# Patient Record
Sex: Female | Born: 1995 | Race: Black or African American | Hispanic: No | Marital: Single | Smoking: Never smoker
Health system: Southern US, Community
[De-identification: ages and names within clinical notes are randomized; demographics above are authoritative.]

## PROBLEM LIST (undated history)

## (undated) DIAGNOSIS — E282 Polycystic ovarian syndrome: Secondary | ICD-10-CM

## (undated) DIAGNOSIS — E669 Obesity, unspecified: Secondary | ICD-10-CM

## (undated) HISTORY — DX: Obesity, unspecified: E66.9

## (undated) HISTORY — DX: Polycystic ovarian syndrome: E28.2

---

## 2019-10-03 ENCOUNTER — Ambulatory Visit: Payer: Self-pay | Admitting: Obstetrics and Gynecology

## 2020-05-24 ENCOUNTER — Telehealth: Payer: Self-pay | Admitting: Hematology and Oncology

## 2020-05-24 NOTE — Telephone Encounter (Signed)
Received a new hem referral from Dr. Juliene Pina for fhx of thromboembolic disorder. Ms. cuello has been cld and scheduled to see Dr. Al Pimple on 3/31 at 1pm. Pt aware to arrive 20 minutes early.

## 2020-06-07 ENCOUNTER — Inpatient Hospital Stay: Payer: BLUE CROSS/BLUE SHIELD

## 2020-06-07 ENCOUNTER — Other Ambulatory Visit: Payer: Self-pay

## 2020-06-07 ENCOUNTER — Encounter: Payer: Self-pay | Admitting: Hematology and Oncology

## 2020-06-07 ENCOUNTER — Inpatient Hospital Stay: Payer: BLUE CROSS/BLUE SHIELD | Attending: Hematology and Oncology | Admitting: Hematology and Oncology

## 2020-06-07 ENCOUNTER — Encounter: Payer: Self-pay | Admitting: General Practice

## 2020-06-07 VITALS — BP 103/73 | HR 85 | Temp 97.9°F | Resp 18 | Ht 62.0 in | Wt 198.4 lb

## 2020-06-07 DIAGNOSIS — Z832 Family history of diseases of the blood and blood-forming organs and certain disorders involving the immune mechanism: Secondary | ICD-10-CM | POA: Insufficient documentation

## 2020-06-07 DIAGNOSIS — Z823 Family history of stroke: Secondary | ICD-10-CM

## 2020-06-07 DIAGNOSIS — Z79899 Other long term (current) drug therapy: Secondary | ICD-10-CM | POA: Diagnosis not present

## 2020-06-07 DIAGNOSIS — E282 Polycystic ovarian syndrome: Secondary | ICD-10-CM | POA: Insufficient documentation

## 2020-06-07 DIAGNOSIS — Z8249 Family history of ischemic heart disease and other diseases of the circulatory system: Secondary | ICD-10-CM

## 2020-06-07 DIAGNOSIS — E663 Overweight: Secondary | ICD-10-CM | POA: Insufficient documentation

## 2020-06-07 NOTE — Progress Notes (Signed)
[facility] CONSULT NOTE  Patient Care Team: Pcp, No as PCP - General  CHIEF COMPLAINTS/PURPOSE OF CONSULTATION:  Family history of embolic disorder.  ASSESSMENT & PLAN:  No problem-specific Assessment & Plan notes found for this encounter.  Orders Placed This Encounter  Procedures  . Factor 5 leiden    Standing Status:   Future    Standing Expiration Date:   06/07/2021  . Ambulatory referral to Social Work    Referral Priority:   Routine    Referral Type:   Consultation    Referral Reason:   Specialty Services Required    Number of Visits Requested:   1   This is a very pleasant 25 year old female patient with past medical history significant for PCOS and related health issues referred to hematology because of her family history of thromboembolic disorder.  Patient has never had any personal history of thromboembolic disorders however her mom and maternal grandmother had blood clots.  Her mom had her PE while on birth control pills.  Patient is currently taking progestin only pill but was wondering if she has any risk of blood clots with this medication.  She denies any pregnancies or miscarriages. I discussed that progestin only medication does not increase risk of blood clots significantly.  However if she were to take estrogen-based medication, it may be of value to test her factor V Leiden status.  Even if factor V Leiden is negative, if she can avoid estrogen-based birth control given her family history, this would be recommended.  Since she never had any personal history of DVT/PE, I do not believe that she needs an entire hypercoagulable work panel.  I also discussed that pregnancy is a high estrogen state and increases risk of blood clots.  I have asked her to return to clinic when pregnant to discuss role of prophylactic anticoagulation and to assess for hypercoagulable disorder. We have discussed about symptoms and signs of DVT/PE and to go to the nearest hospital if  she notices any of the symptoms. She expressed understanding of all the recommendations.  Thank you for consulting [place] in the care of this patient.   Please not hesitate to contact us with any additional questions or concerns.  Rachel Moulds MD   HISTORY OF PRESENTING ILLNESS:   [place]and[place] Wise 25 y.o. female is here because of family history of thromboembolic disorder. This is a very pleasant 25 year old female patient referred to hematology for recommendations given her family history of DVT/PE.  Patient presently has never had any thromboembolic episodes.  Her mom had PE while she was on birth control pills also had a DVT but the context is not clear.  Patient has personal history of PCOS and has been tried on several progestin only medications, currently on camela which is norethindrone.  She has never had to take any estrogen-based medication. She feels well except for the overweight and related problems.  She is motivated to lose weight and is currently taking Metformin, Phentermine and norethindrone Rest of the pertinent 10 point ROS reviewed and negative.  REVIEW OF SYSTEMS:   Constitutional: Denies fevers, chills or abnormal night sweats Eyes: Denies blurriness of vision, double vision or watery eyes Ears, nose, mouth, throat, and face: Denies mucositis or sore throat Respiratory: Denies cough, dyspnea or wheezes Cardiovascular: Denies palpitation, chest discomfort or lower extremity swelling Gastrointestinal:  Denies nausea, heartburn or change in bowel habits Skin: Denies abnormal skin rashes Lymphatics: Denies new lymphadenopathy or easy bruising Neurological:Denies numbness,  tingling or new weaknesses Behavioral/Psych: Mood is stable, no new changes  All other systems were reviewed with the patient and are negative.  MEDICAL HISTORY:  History reviewed. No pertinent past medical history.  SURGICAL HISTORY: History reviewed. No pertinent surgical history.  SOCIAL  HISTORY: Social History   Socioeconomic History  . Marital status: Single    Spouse name: Not on file  . Number of children: Not on file  . Years of education: Not on file  . Highest education level: Not on file  Occupational History  . Not on file  Tobacco Use  . Smoking status: Not on file  . Smokeless tobacco: Not on file  Substance and Sexual Activity  . Alcohol use: Not on file  . Drug use: Not on file  . Sexual activity: Not on file  Other Topics Concern  . Not on file  Social History Narrative  . Not on file   Social Determinants of Health   Financial Resource Strain: Not on file  Food Insecurity: Not on file  Transportation Needs: Not on file  Physical Activity: Not on file  Stress: Not on file  Social Connections: Not on file  Intimate Partner Violence: Not on file    FAMILY HISTORY: History reviewed. No pertinent family history.  No family history of malignancies.  ALLERGIES:  has No Known Allergies.  MEDICATIONS:  Current Outpatient Medications  Medication Sig Dispense Refill  . Drospirenone (SLYND) 4 MG TABS Take 4 mg by mouth 1 day or 1 dose.     No current facility-administered medications for this visit.     PHYSICAL EXAMINATION:  ECOG PERFORMANCE STATUS: 0 - Asymptomatic  Vitals:   06/07/20 1333  BP: 103/73  Pulse: 85  Resp: 18  Temp: 97.9 F (36.6 C)  SpO2: 100%   Filed Weights   06/07/20 1333  Weight: 198 lb 6.4 oz (90 kg)    GENERAL:alert, no distress and comfortable SKIN: skin color, texture, turgor are normal, no rashes or significant lesions EYES: normal, conjunctiva are pink and non-injected, sclera clear OROPHARYNX:no exudate, no erythema and lips, buccal mucosa, and tongue normal  NECK: supple, thyroid normal size, non-tender, without nodularity LYMPH:  no palpable lymphadenopathy in the cervical, axillary or inguinal LUNGS: clear to auscultation and percussion with normal breathing effort HEART: regular rate & rhythm  and no murmurs and no lower extremity edema ABDOMEN:abdomen soft, non-tender and normal bowel sounds Musculoskeletal:no cyanosis of digits and no clubbing  PSYCH: alert & oriented x 3 with fluent speech NEURO: no focal motor/sensory deficits  LABORATORY DATA:  I have reviewed the data as listed No results found for: WBC, HGB, HCT, MCV, PLT   Chemistry   No results found for: NA, K, CL, CO2, BUN, CREATININE, GLU No results found for: CALCIUM, ALKPHOS, AST, ALT, BILITOT     RADIOGRAPHIC STUDIES: I have personally reviewed the radiological images as listed and agreed with the findings in the report. No results found.  All questions were answered. The patient knows to call the clinic with any problems, questions or concerns. I spent 30 minutes in the care of this patient including H and P, review of records, counseling and coordination of care.     Rachel Moulds, MD 06/07/2020 2:09 PM

## 2020-06-07 NOTE — Progress Notes (Signed)
[facility] Psychosocial Distress Screening Clinical Social Work  Clinical Social Work was referred by distress screening protocol.  The patient scored a 9 on the Psychosocial Distress Thermometer which indicates severe distress. Clinical Social Worker contacted patient by phone to assess for distress and other psychosocial needs. Called patient, no answer, left VM w my contact information and encouragement to call back.   ONCBCN DISTRESS SCREENING 06/07/2020  Screening Type Initial Screening  Distress experienced in past week (1-10) 9  Family Problem type Partner  Emotional problem type Nervousness/Anxiety;Isolation/feeling alone;Depression  Physical Problem type Loss of appetitie  Physician notified of physical symptoms Yes  Referral to clinical social work Yes  Referral to support programs Yes    Clinical Social Worker follow up needed: No.  If yes, follow up plan:  Sallee Lange, LCSW   Santa Genera, LCSW Clinical Social Worker Phone:  [phone]

## 2020-06-08 ENCOUNTER — Encounter: Payer: Self-pay | Admitting: General Practice

## 2020-06-08 NOTE — Progress Notes (Signed)
[facility] Psychosocial Distress Screening Clinical Social Work  Clinical Social Work was referred by distress screening protocol.  The patient scored a 9 on the Psychosocial Distress Thermometer which indicates severe distress. Clinical Social Worker contacted patient by phone to assess for distress and other psychosocial needs. Patient answered, stated "this is not a good time to talk, can I call you on Monday."  Patient will call CSW Craig on Monday.    ONCBCN DISTRESS SCREENING 06/07/2020  Screening Type Initial Screening  Distress experienced in past week (1-10) 9  Family Problem type Partner  Emotional problem type Nervousness/Anxiety;Isolation/feeling alone;Depression  Physical Problem type Loss of appetitie  Physician notified of physical symptoms Yes  Referral to clinical social work Yes  Referral to support programs Yes    Clinical Social Worker follow up needed: Yes.    If yes, follow up plan:  See note  Sallee Lange, LCSW   Santa Genera, LCSW Clinical Social Worker Phone:  [phone]

## 2020-06-14 ENCOUNTER — Telehealth: Payer: Self-pay

## 2020-06-14 LAB — FACTOR 5 LEIDEN

## 2020-06-14 NOTE — Telephone Encounter (Signed)
-----   Message from Rachel Moulds, MD sent at 06/14/2020  1:13 PM EDT ----- Regarding: RE: Questions Regarding Recent Factor 5 Leiden Labs Contact: [phone] Sure, if you want to let her know since its negative. She doesn't have the factor V leiden mutation.  I can call her later.  ----- Message ----- From: Laverta Baltimore, RN Sent: 06/14/2020  11:23 AM EDT To: Rachel Moulds, MD Subject: Questions Regarding Recent Factor 5 Leiden L#  Hello Dr. Al Pimple, Patient called requesting information about her recent lab results. If you would be able to call her back at your convenience, the patient would appreciate it.  Thank you!

## 2020-06-14 NOTE — Telephone Encounter (Signed)
Patient informed of her recent lab results. Patient verbalized an understanding of the information and had no further questions or concerns.

## 2020-09-19 ENCOUNTER — Other Ambulatory Visit: Payer: Self-pay

## 2020-09-19 ENCOUNTER — Encounter (HOSPITAL_COMMUNITY): Payer: Self-pay

## 2020-09-19 ENCOUNTER — Emergency Department (HOSPITAL_COMMUNITY)
Admission: EM | Admit: 2020-09-19 | Discharge: 2020-09-20 | Disposition: A | Discharge status: Home / Self Care | Payer: BLUE CROSS/BLUE SHIELD | Attending: Emergency Medicine | Admitting: Emergency Medicine

## 2020-09-19 ENCOUNTER — Emergency Department (HOSPITAL_COMMUNITY): Payer: BLUE CROSS/BLUE SHIELD

## 2020-09-19 DIAGNOSIS — N83291 Other ovarian cyst, right side: Secondary | ICD-10-CM | POA: Insufficient documentation

## 2020-09-19 DIAGNOSIS — R1031 Right lower quadrant pain: Secondary | ICD-10-CM | POA: Diagnosis present

## 2020-09-19 DIAGNOSIS — N83209 Unspecified ovarian cyst, unspecified side: Secondary | ICD-10-CM

## 2020-09-19 LAB — CBC
HCT: 40.7 % (ref 36.0–46.0)
Hemoglobin: 13.3 g/dL (ref 12.0–15.0)
MCH: 29.4 pg (ref 26.0–34.0)
MCHC: 32.7 g/dL (ref 30.0–36.0)
MCV: 89.8 fL (ref 80.0–100.0)
Platelets: 315 10*3/uL (ref 150–400)
RBC: 4.53 MIL/uL (ref 3.87–5.11)
RDW: 12.8 % (ref 11.5–15.5)
WBC: 7.1 10*3/uL (ref 4.0–10.5)
nRBC: 0 % (ref 0.0–0.2)

## 2020-09-19 LAB — URINALYSIS, ROUTINE W REFLEX MICROSCOPIC
Bilirubin Urine: NEGATIVE
Glucose, UA: NEGATIVE mg/dL
Ketones, ur: NEGATIVE mg/dL
Nitrite: NEGATIVE
Protein, ur: NEGATIVE mg/dL
Specific Gravity, Urine: 1.004 — ABNORMAL LOW (ref 1.005–1.030)
pH: 6 (ref 5.0–8.0)

## 2020-09-19 LAB — COMPREHENSIVE METABOLIC PANEL
ALT: 13 U/L (ref 0–44)
AST: 15 U/L (ref 15–41)
Albumin: 4.2 g/dL (ref 3.5–5.0)
Alkaline Phosphatase: 58 U/L (ref 38–126)
Anion gap: 7 (ref 5–15)
BUN: 6 mg/dL (ref 6–20)
CO2: 25 mmol/L (ref 22–32)
Calcium: 9.3 mg/dL (ref 8.9–10.3)
Chloride: 107 mmol/L (ref 98–111)
Creatinine, Ser: 0.77 mg/dL (ref 0.44–1.00)
GFR, Estimated: >60 mL/min (ref >60)
Glucose, Bld: 92 mg/dL (ref 70–99)
Potassium: 3.8 mmol/L (ref 3.5–5.1)
Sodium: 139 mmol/L (ref 135–145)
Total Bilirubin: 0.6 mg/dL (ref 0.3–1.2)
Total Protein: 7.4 g/dL (ref 6.5–8.1)

## 2020-09-19 LAB — LIPASE, BLOOD: Lipase: 27 U/L (ref 11–51)

## 2020-09-19 LAB — I-STAT BETA HCG BLOOD, ED (MC, WL, AP ONLY): I-stat hCG, quantitative: <5 m[IU]/mL (ref <5)

## 2020-09-19 MED ORDER — ONDANSETRON HCL 4 MG/2ML IJ SOLN
4.0000 mg | Freq: Once | INTRAMUSCULAR | Status: AC
  Administered 2020-09-19: 4 mg via INTRAVENOUS
  Filled 2020-09-19: qty 2

## 2020-09-19 MED ORDER — HYDROMORPHONE HCL 1 MG/ML IJ SOLN
1.0000 mg | Freq: Once | INTRAMUSCULAR | Status: AC
  Administered 2020-09-19: 1 mg via INTRAVENOUS
  Filled 2020-09-19: qty 1

## 2020-09-19 MED ORDER — IOHEXOL 350 MG/ML SOLN
80.0000 mL | Freq: Once | INTRAVENOUS | Status: AC | PRN
  Administered 2020-09-19: 80 mL via INTRAVENOUS

## 2020-09-19 MED ORDER — HYDROMORPHONE HCL 1 MG/ML IJ SOLN
1.0000 mg | Freq: Once | INTRAMUSCULAR | Status: AC
Start: 2020-09-19 — End: 2020-09-19
  Administered 2020-09-19: 1 mg via INTRAVENOUS
  Filled 2020-09-19: qty 1

## 2020-09-19 MED ORDER — SODIUM CHLORIDE (PF) 0.9 % IJ SOLN
INTRAMUSCULAR | Status: AC
  Filled 2020-09-19: qty 50

## 2020-09-19 MED ORDER — ONDANSETRON HCL 4 MG/2ML IJ SOLN
4.0000 mg | Freq: Once | INTRAMUSCULAR | Status: AC
Start: 2020-09-19 — End: 2020-09-19
  Administered 2020-09-19: 4 mg via INTRAVENOUS
  Filled 2020-09-19: qty 2

## 2020-09-19 NOTE — ED Notes (Signed)
Pt to CT, will update V/S upon return

## 2020-09-19 NOTE — ED Provider Notes (Signed)
[facility]-EMERGENCY DEPT Provider Note   CSN: [id number] Arrival date & time: 09/19/20  1919     History Chief Complaint  Patient presents with   Abdominal Pain    Alexandria Wise is a 25 y.o. female.  [name] has a history of PCOS and endometriosis.  She states that her right lower quadrant abdominal pain initially felt like her period cramps, but she is not bleeding.  Pain worsened to the point where she sought care at urgent care.  She was sent for further evaluation.  The history is provided by the patient.  Abdominal Pain Pain location:  RLQ Pain quality: cramping and sharp   Pain radiates to:  Does not radiate Pain severity:  Severe Onset quality:  Gradual Duration:  2 days Timing:  Constant Progression:  Worsening Chronicity:  New Context: not sick contacts, not suspicious food intake and not trauma   Relieved by:  Nothing Worsened by:  Nothing Ineffective treatments:  NSAIDs Associated symptoms: no anorexia, no chest pain, no chills, no constipation, no cough, no diarrhea, no dysuria, no fever, no hematuria, no nausea, no shortness of breath, no sore throat, no vaginal bleeding, no vaginal discharge and no vomiting       Past Medical History:  Diagnosis Date   Obesity (BMI 35.0-39.9 without comorbidity)    PCOS (polycystic ovarian syndrome)     There are no problems to display for this patient.   History reviewed. No pertinent surgical history.   OB History   No obstetric history on file.     History reviewed. No pertinent family history.  Social History   Tobacco Use   Smoking status: Never  Substance Use Topics   Drug use: Never    Home Medications Prior to Admission medications   Medication Sig Start Date End Date Taking? Authorizing Provider  Drospirenone (SLYND) 4 MG TABS Take 4 mg by mouth 1 day or 1 dose.    [provider]    Allergies    Patient has no known allergies.  Review of Systems    Review of Systems  Constitutional:  Negative for chills and fever.  HENT:  Negative for ear pain and sore throat.   Eyes:  Negative for pain and visual disturbance.  Respiratory:  Negative for cough and shortness of breath.   Cardiovascular:  Negative for chest pain and palpitations.  Gastrointestinal:  Positive for abdominal pain. Negative for anorexia, constipation, diarrhea, nausea and vomiting.  Genitourinary:  Negative for dysuria, hematuria, vaginal bleeding and vaginal discharge.  Musculoskeletal:  Negative for arthralgias and back pain.  Skin:  Negative for color change and rash.  Neurological:  Negative for seizures and syncope.  All other systems reviewed and are negative.  Physical Exam Updated Vital Signs BP 112/70   Pulse 73   Temp 99 F (37.2 C) (Oral)   Resp 17   Ht 5\' 2"  (1.575 m)   Wt 89.4 kg   SpO2 100%   BMI 36.03 kg/m   Physical Exam Vitals and nursing note reviewed.  HENT:     Head: Normocephalic and atraumatic.  Eyes:     General: No scleral icterus. Pulmonary:     Effort: Pulmonary effort is normal. No respiratory distress.  Abdominal:     Tenderness: There is abdominal tenderness in the right lower quadrant.  Musculoskeletal:     Cervical back: Normal range of motion.  Skin:    General: Skin is warm and dry.  Neurological:  Mental Status: She is alert.  Psychiatric:        Mood and Affect: Mood normal.    ED Results / Procedures / Treatments   Labs (all labs ordered are listed, but only abnormal results are displayed) Labs Reviewed  URINALYSIS, ROUTINE W REFLEX MICROSCOPIC - Abnormal; Notable for the following components:      Result Value   Color, Urine STRAW (*)    Specific Gravity, Urine 1.004 (*)    Hgb urine dipstick MODERATE (*)    Leukocytes,Ua SMALL (*)    Bacteria, UA MANY (*)    All other components within normal limits  LIPASE, BLOOD  COMPREHENSIVE METABOLIC PANEL  CBC  I-STAT BETA HCG BLOOD, ED ([facility], [facility], [facility] ONLY)     EKG None  Radiology US Transvaginal Non-OB  Result Date: 09/20/2020 CLINICAL DATA:  Right lower quadrant pain, ovarian mass EXAM: TRANSABDOMINAL AND TRANSVAGINAL ULTRASOUND OF PELVIS DOPPLER ULTRASOUND OF OVARIES TECHNIQUE: Both transabdominal and transvaginal ultrasound examinations of the pelvis were performed. Transabdominal technique was performed for global imaging of the pelvis including uterus, ovaries, adnexal regions, and pelvic cul-de-sac. It was necessary to proceed with endovaginal exam following the transabdominal exam to visualize the uterus, endometrium, and ovaries. Color and duplex Doppler ultrasound was utilized to evaluate blood flow to the ovaries. COMPARISON:  CT 09/19/2020 FINDINGS: Uterus Measurements: 7.1 x 3.6 x 4 cm = volume: 53.8 mL. Anteverted uterus. No concerning uterine mass or lesion. Endometrium Thickness: 4 mm, non thickened.  No focal endometrial abnormality. Right ovary Measurements: 10.1 x 8 x 8.5 cm = volume: 362.8 mL. 8.9 x 6.3 x 8 cm predominantly cystic appearing mass with some low level homogeneous internal echogenicity along the periphery (image 100) as well as a larger heterogeneous though avascular mural nodule measuring up to 3 cm in size (image 96). Left ovary Measurements: 4.3 x 2.5 x 2.6 cm = volume: 14.4 mL. Normal follicles in the left ovary. No concerning left ovarian mass is seen. Pulsed Doppler evaluation of both ovaries demonstrates normal low-resistance arterial and venous waveforms. Other findings Small to moderate volume of hemoperitoneum best demonstrated on image 68. IMPRESSION: Large solid cystic mass arising from the right ovary with some conspicuous internal complexity. Portion of this mass demonstrates homogeneous low level echogenicity with convex margins which could suggest an underlying hemorrhagic lesion with a larger avascular mural nodule possibly reflecting adherent clot. However, based on the size of this lesion, outpatient MR  imaging should be pursued with close clinical follow-up by gynecology. Small volume of hemoperitoneum could be related to cyst rupture or possible rupture of the aforementioned possible hemorrhagic cyst. Preserved Doppler waveforms of the right and left ovary though certainly given the size of the mass in the right adnexa, intermittent torsion cannot be fully excluded. These results were called by telephone at the time of interpretation on 09/20/2020 at 12:18 am to provider [facility] , who verbally acknowledged these results. Electronically Signed   By: Kreg Shropshire M.D.   On: 09/20/2020 00:18   US Pelvis Complete  Result Date: 09/20/2020 CLINICAL DATA:  Right lower quadrant pain, ovarian mass EXAM: TRANSABDOMINAL AND TRANSVAGINAL ULTRASOUND OF PELVIS DOPPLER ULTRASOUND OF OVARIES TECHNIQUE: Both transabdominal and transvaginal ultrasound examinations of the pelvis were performed. Transabdominal technique was performed for global imaging of the pelvis including uterus, ovaries, adnexal regions, and pelvic cul-de-sac. It was necessary to proceed with endovaginal exam following the transabdominal exam to visualize the uterus, endometrium, and ovaries. Color and duplex Doppler ultrasound  was utilized to evaluate blood flow to the ovaries. COMPARISON:  CT 09/19/2020 FINDINGS: Uterus Measurements: 7.1 x 3.6 x 4 cm = volume: 53.8 mL. Anteverted uterus. No concerning uterine mass or lesion. Endometrium Thickness: 4 mm, non thickened.  No focal endometrial abnormality. Right ovary Measurements: 10.1 x 8 x 8.5 cm = volume: 362.8 mL. 8.9 x 6.3 x 8 cm predominantly cystic appearing mass with some low level homogeneous internal echogenicity along the periphery (image 100) as well as a larger heterogeneous though avascular mural nodule measuring up to 3 cm in size (image 96). Left ovary Measurements: 4.3 x 2.5 x 2.6 cm = volume: 14.4 mL. Normal follicles in the left ovary. No concerning left ovarian mass is seen. Pulsed Doppler  evaluation of both ovaries demonstrates normal low-resistance arterial and venous waveforms. Other findings Small to moderate volume of hemoperitoneum best demonstrated on image 68. IMPRESSION: Large solid cystic mass arising from the right ovary with some conspicuous internal complexity. Portion of this mass demonstrates homogeneous low level echogenicity with convex margins which could suggest an underlying hemorrhagic lesion with a larger avascular mural nodule possibly reflecting adherent clot. However, based on the size of this lesion, outpatient MR imaging should be pursued with close clinical follow-up by gynecology. Small volume of hemoperitoneum could be related to cyst rupture or possible rupture of the aforementioned possible hemorrhagic cyst. Preserved Doppler waveforms of the right and left ovary though certainly given the size of the mass in the right adnexa, intermittent torsion cannot be fully excluded. These results were called by telephone at the time of interpretation on 09/20/2020 at 12:18 am [facility] , who verbally acknowledged these results. Electronically Signed   By: Kreg ShropshirePrice  DeHay M.D.   On: 09/20/2020 00:18   CT Abdomen Pelvis W Contrast  Result Date: 09/19/2020 CLINICAL DATA:  Right lower quadrant abdominal pain, potential appendicitis EXAM: CT ABDOMEN AND PELVIS WITH CONTRAST TECHNIQUE: Multidetector CT imaging of the abdomen and pelvis was performed using the standard protocol following bolus administration of intravenous contrast. CONTRAST:  80mL OMNIPAQUE IOHEXOL 350 MG/ML SOLN COMPARISON:  None. FINDINGS: Lower chest: Lung bases are clear. Normal heart size. No pericardial effusion. Hepatobiliary: No worrisome focal liver lesions. Smooth liver surface contour. Normal hepatic attenuation. Normal gallbladder and biliary tree. Pancreas: No pancreatic ductal dilatation or surrounding inflammatory changes. Spleen: Normal in size. No concerning splenic lesions. Adrenals/Urinary  Tract: Normal adrenal glands. Kidneys are normally located with symmetric enhancement. No suspicious renal lesion, urolithiasis or hydronephrosis. Urinary bladder is unremarkable for the degree of distention. Stomach/Bowel: Distal esophagus, stomach and duodenal sweep are unremarkable. No small bowel wall thickening or dilatation. No evidence of obstruction. Partially air-filled appendix is seen in the right lower quadrant without focal periappendiceal inflammation to suggest an acute appendicitis. No colonic dilatation or wall thickening. Vascular/Lymphatic: No significant vascular findings are present. No enlarged abdominal or pelvic lymph nodes. Reproductive: Large 7.2 cm cystic lesion arising from the right ovary which appears displaced anteriorly to the uterus. The uterus itself appears slightly retroverted. No concerning left adnexal mass or lesion. Other: Small volume low to intermediate attenuation free fluid in the deep pelvis, indeterminate though could reflect small volume hemoperitoneum such as from a cyst rupture or other pelvic process. Musculoskeletal: No acute osseous abnormality or suspicious osseous lesion. IMPRESSION: 1. 7.2 cm cystic lesion arising from the right ovary. Given that this is ipsilateral to the patient's symptoms, may warrant further interrogation with pelvic ultrasound and Doppler interrogation. 2. Small volume  of low to intermediate attenuation (30 HU) fluid in the deep pelvis is nonspecific, though could reflect some trace hemorrhagic products such as related to cyst rupture. Could also be further assessed at the time of ultrasound. 3. Normal appendix. Electronically Signed   By: Kreg Shropshire M.D.   On: 09/19/2020 22:18   [place] Art/Ven Flow Abd Pelv Doppler  Result Date: 09/20/2020 CLINICAL DATA:  Right lower quadrant pain, ovarian mass EXAM: TRANSABDOMINAL AND TRANSVAGINAL ULTRASOUND OF PELVIS DOPPLER ULTRASOUND OF OVARIES TECHNIQUE: Both transabdominal and transvaginal  ultrasound examinations of the pelvis were performed. Transabdominal technique was performed for global imaging of the pelvis including uterus, ovaries, adnexal regions, and pelvic cul-de-sac. It was necessary to proceed with endovaginal exam following the transabdominal exam to visualize the uterus, endometrium, and ovaries. Color and duplex Doppler ultrasound was utilized to evaluate blood flow to the ovaries. COMPARISON:  CT 09/19/2020 FINDINGS: Uterus Measurements: 7.1 x 3.6 x 4 cm = volume: 53.8 mL. Anteverted uterus. No concerning uterine mass or lesion. Endometrium Thickness: 4 mm, non thickened.  No focal endometrial abnormality. Right ovary Measurements: 10.1 x 8 x 8.5 cm = volume: 362.8 mL. 8.9 x 6.3 x 8 cm predominantly cystic appearing mass with some low level homogeneous internal echogenicity along the periphery (image 100) as well as a larger heterogeneous though avascular mural nodule measuring up to 3 cm in size (image 96). Left ovary Measurements: 4.3 x 2.5 x 2.6 cm = volume: 14.4 mL. Normal follicles in the left ovary. No concerning left ovarian mass is seen. Pulsed Doppler evaluation of both ovaries demonstrates normal low-resistance arterial and venous waveforms. Other findings Small to moderate volume of hemoperitoneum best demonstrated on image 68. IMPRESSION: Large solid cystic mass arising from the right ovary with some conspicuous internal complexity. Portion of this mass demonstrates homogeneous low level echogenicity with convex margins which could suggest an underlying hemorrhagic lesion with a larger avascular mural nodule possibly reflecting adherent clot. However, based on the size of this lesion, outpatient MR imaging should be pursued with close clinical follow-up by gynecology. Small volume of hemoperitoneum could be related to cyst rupture or possible rupture of the aforementioned possible hemorrhagic cyst. Preserved Doppler waveforms of the right and left ovary though certainly  given the size of the mass in the right adnexa, intermittent torsion cannot be fully excluded. These results were called by telephone at the time of interpretation on 09/20/2020 at 12:18 am to provider [facility] , who verbally acknowledged these results. Electronically Signed   By: Kreg Shropshire M.D.   On: 09/20/2020 00:18    Procedures Procedures   Medications Ordered in ED Medications  HYDROmorphone (DILAUDID) injection 1 mg (has no administration in time range)  ondansetron (ZOFRAN) injection 4 mg (has no administration in time range)    ED Course  I have reviewed the triage vital signs and the nursing notes.  Pertinent labs & imaging results that were available during my care of the patient were reviewed by me and considered in my medical decision making (see chart for details).    MDM Rules/Calculators/A&P                          Liticia Drollinger presented with right lower quadrant abdominal pain and concern for appendicitis based on her visit at urgent care.  CT scan revealed a large ovarian cyst, and pelvic ultrasound to further characterize the cyst.  No evidence of ovarian torsion.  She was  given pain control, and follow-up instructions were given. Final Clinical Impression(s) / ED Diagnoses Final diagnoses:  Hemorrhagic cyst of ovary    Rx / DC Orders ED Discharge Orders     None        Koleen Distance, MD 09/20/20 1514

## 2020-09-19 NOTE — ED Provider Notes (Signed)
Emergency Medicine Provider Triage Evaluation Note  Alexandria Wise , a 25 y.o. female  was evaluated in triage.  Pt complains of gradual onset, constant, sharp, RLQ abdominal pain that  began 2 days ago. No fevers, chills,  nausea, vomiting. Has been taking Ibuprofen without relief. Went to an UC today. Had a u/a and a UPT which was negative. They sent her here with concern for appendicitis. No previous abdominal surgeries.  Review of Systems  Positive: + abd pain Negative: - nausea, vomiting, diarrhea, fevers, chills  Physical Exam  BP 119/74 (BP Location: Right Arm)   Pulse 96   Temp 99 F (37.2 C) (Oral)   Resp 18   Ht 5\' 2"  (1.575 m)   Wt 89.4 kg   SpO2 98%   BMI 36.03 kg/m  Gen:   Awake, no distress   Resp:  Normal effort  MSK:   Moves extremities without difficulty  Other:  + diffuse abdominal TTP with voluntary guarding.   Medical Decision Making  Medically screening exam initiated at 7:42 PM.  Appropriate orders placed.  Alexandria Wise was informed that the remainder of the evaluation will be completed by another provider, this initial triage assessment does not replace that evaluation, and the importance of remaining in the ED until their evaluation is complete.     , PA-C 09/19/20 1945    09/21/20, MD 09/19/20 [phone]

## 2020-09-19 NOTE — ED Triage Notes (Signed)
Pt states she is here for potential appendicitis. Pt went to UC today and was examined, no blood work or scans done, pt told to come to ED. Pt c/o abdominal pain to RLQ.

## 2020-09-19 NOTE — ED Provider Notes (Signed)
  Provider Note MRN:  [id number]  Arrival date & time: 09/20/20    ED Course and Medical Decision Making  Assumed care from Dr. Delford Field at shift change.  Lower abdominal pain, CT with large ovarian cyst, awaiting ultrasound and will reassess clinically.  Ultrasound is without evidence of torsion, patient's pain is adequately controlled, will follow-up with GYN.  Procedures  Final Clinical Impressions(s) / ED Diagnoses     ICD-10-CM   1. Hemorrhagic cyst of ovary  N83.209       ED Discharge Orders          Ordered    oxyCODONE (ROXICODONE) 5 MG immediate release tablet  Every 4 hours PRN,   Status:  Discontinued        09/20/20 0051    ondansetron (ZOFRAN ODT) 4 MG disintegrating tablet  Every 8 hours PRN,   Status:  Discontinued        09/20/20 0051    ondansetron (ZOFRAN ODT) 4 MG disintegrating tablet  Every 8 hours PRN        09/20/20 0103    oxyCODONE (ROXICODONE) 5 MG immediate release tablet  Every 4 hours PRN        09/20/20 0103              Discharge Instructions      You were evaluated in the Emergency Department and after careful evaluation, we did not find any emergent condition requiring admission or further testing in the hospital.  Symptoms seem to be due to a large ovarian cyst.  This is a painful condition but should resolve on its own.  Recommend close follow-up with your GYN specialist.  Take Tylenol or Motrin at home for discomfort.  You can use the oxycodone tablets as needed for more significant pain.  Use the Zofran medication as needed for nausea.  The radiologist are recommending that you undergo outpatient MRI at a later date to make sure that this cyst goes away.  Your GYN doctor can schedule this.  Please return to the Emergency Department if you experience any worsening of your condition.   Thank you for allowing [place] to be a part of your care.      Elmer Sow. Pilar Plate, MD [facility] Emergency Medicine [facility]  Health [email or web address]    Sabas Sous, MD 09/20/20 [phone]

## 2020-09-20 MED ORDER — OXYCODONE HCL 5 MG PO TABS: 5.0000 mg | ORAL_TABLET | ORAL | 0 refills | Status: DC | PRN

## 2020-09-20 MED ORDER — OXYCODONE HCL 5 MG PO TABS: 5.0000 mg | ORAL_TABLET | ORAL | 0 refills | Status: AC | PRN

## 2020-09-20 MED ORDER — ONDANSETRON 4 MG PO TBDP: 4.0000 mg | ORAL_TABLET | Freq: Three times a day (TID) | ORAL | 0 refills | Status: AC | PRN

## 2020-09-20 MED ORDER — HYDROMORPHONE HCL 1 MG/ML IJ SOLN
0.5000 mg | Freq: Once | INTRAMUSCULAR | Status: AC
  Administered 2020-09-20: 0.5 mg via INTRAVENOUS
  Filled 2020-09-20: qty 1

## 2020-09-20 MED ORDER — ONDANSETRON 4 MG PO TBDP: 4.0000 mg | ORAL_TABLET | Freq: Three times a day (TID) | ORAL | 0 refills | Status: DC | PRN

## 2020-09-20 NOTE — Discharge Instructions (Addendum)
You were evaluated in the Emergency Department and after careful evaluation, we did not find any emergent condition requiring admission or further testing in the hospital.  Symptoms seem to be due to a large ovarian cyst.  This is a painful condition but should resolve on its own.  Recommend close follow-up with your GYN specialist.  Take Tylenol or Motrin at home for discomfort.  You can use the oxycodone tablets as needed for more significant pain.  Use the Zofran medication as needed for nausea.  The radiologist are recommending that you undergo outpatient MRI at a later date to make sure that this cyst goes away.  Your GYN doctor can schedule this.  Please return to the Emergency Department if you experience any worsening of your condition.   Thank you for allowing [place] to be a part of your care.

## 2023-03-12 IMAGING — US US PELVIS COMPLETE
1 series · 14 of 25 positions shown · non-contrast
Comparison: CT 09/19/2020

CLINICAL DATA: Right lower quadrant pain, ovarian mass

EXAM:
TRANSABDOMINAL AND TRANSVAGINAL ULTRASOUND OF PELVIS
DOPPLER ULTRASOUND OF OVARIES
TECHNIQUE: Both transabdominal and transvaginal ultrasound examinations of the
pelvis were performed. Transabdominal technique was performed for
global imaging of the pelvis including uterus, ovaries, adnexal
regions, and pelvic cul-de-sac.
It was necessary to proceed with endovaginal exam following the
transabdominal exam to visualize the uterus, endometrium, and
ovaries. Color and duplex Doppler ultrasound was utilized to
evaluate blood flow to the ovaries.

[Series 1: us pelvis complete [facility] & [facility] · 14 of 109 slices shown]
[im 1/109]
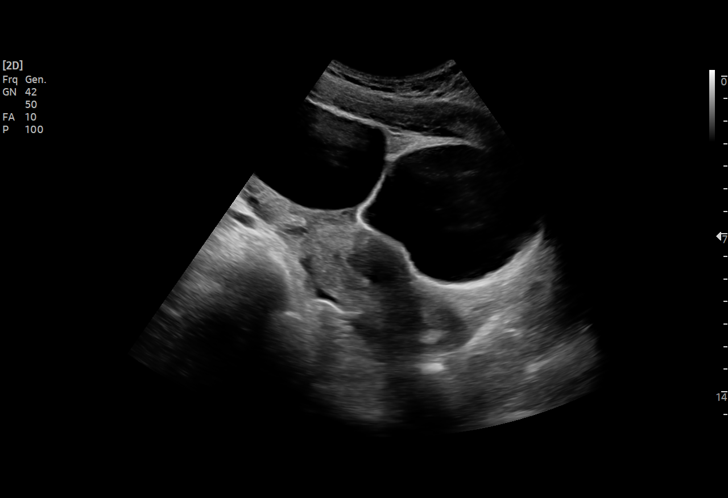
[im 10/109]
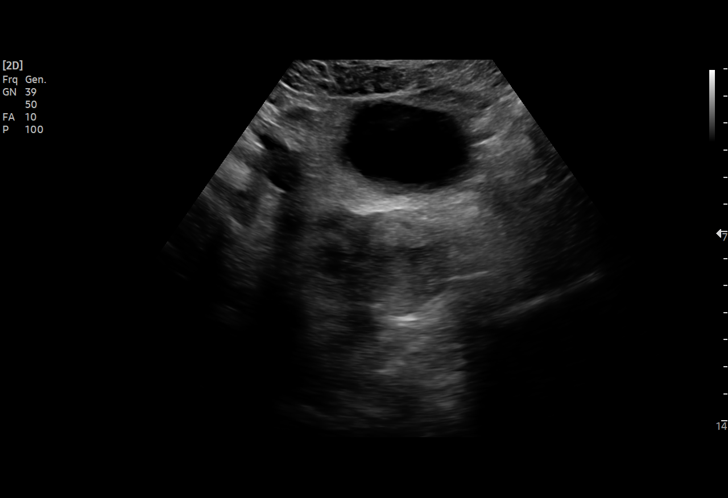
[im 19/109]
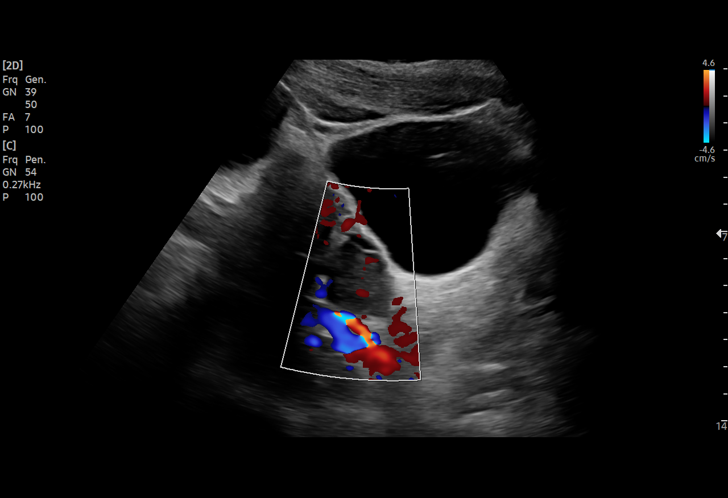
[im 28/109]
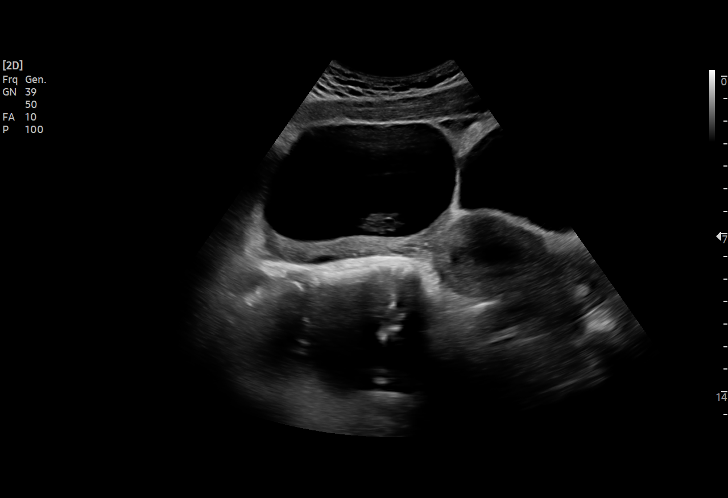
[im 37/109]
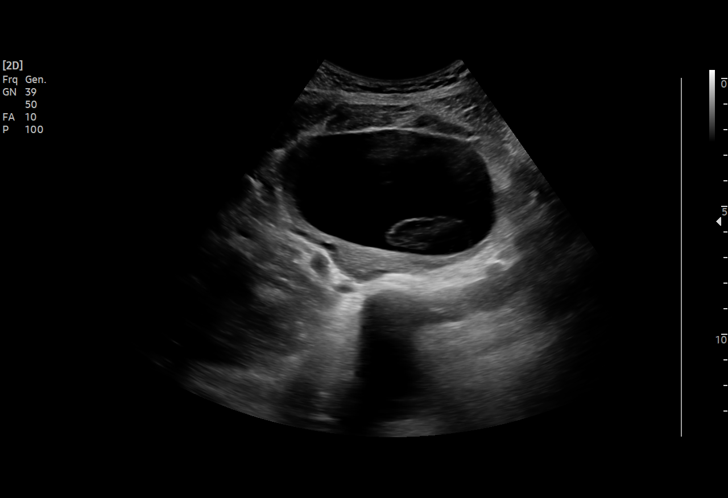
[im 41/109]
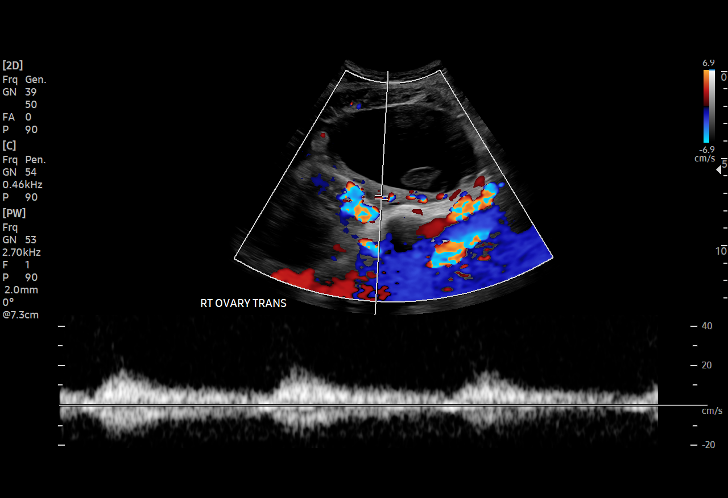
[im 50/109]
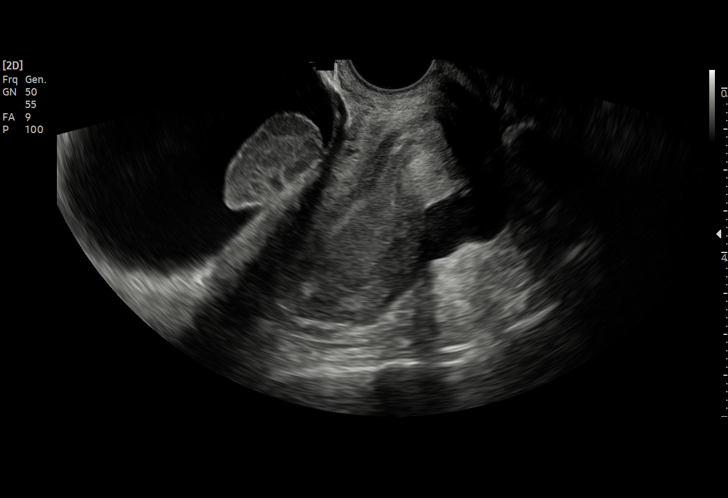
[im 59/109]
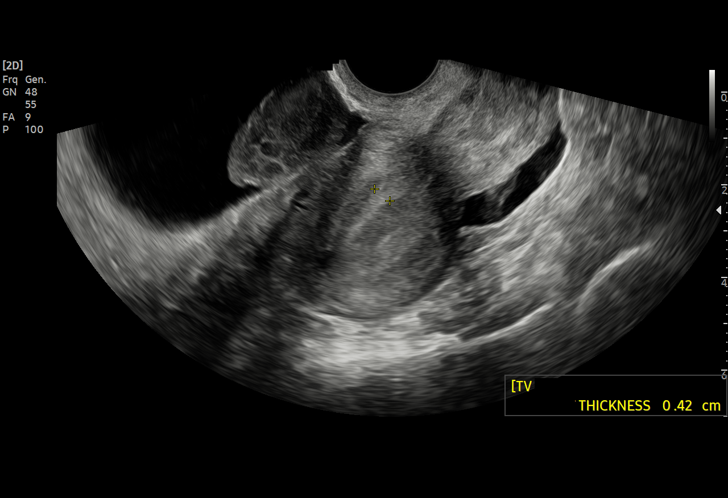
[im 68/109]
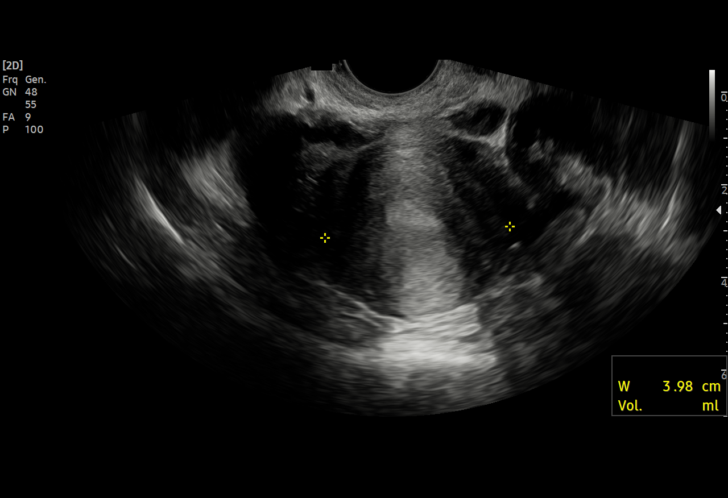
[im 73/109]
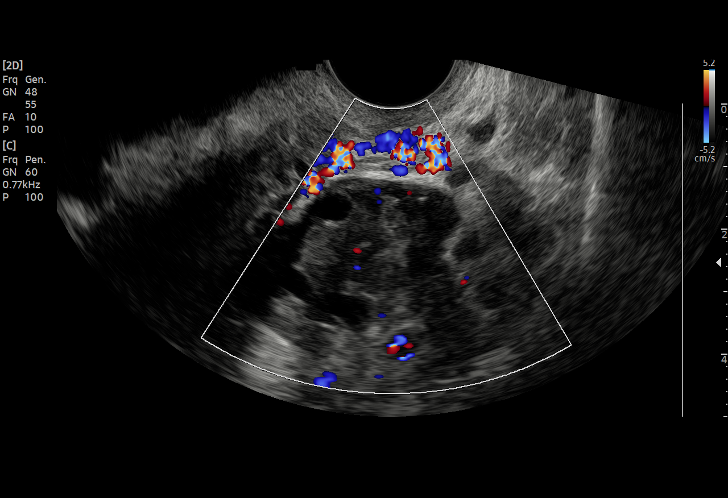
[im 82/109]
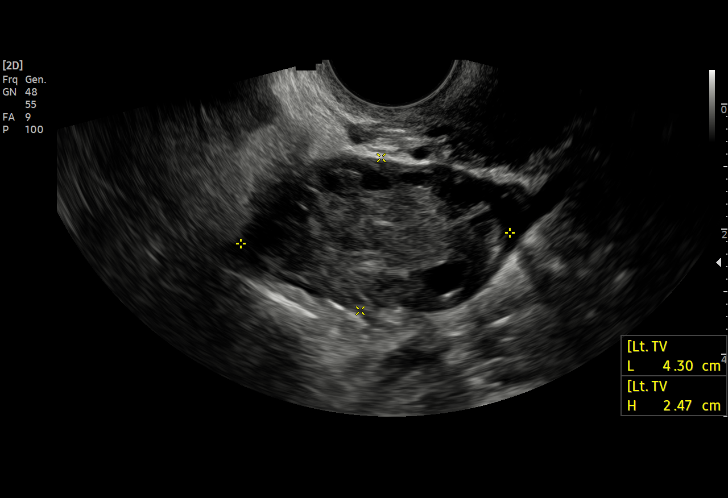
[im 91/109]
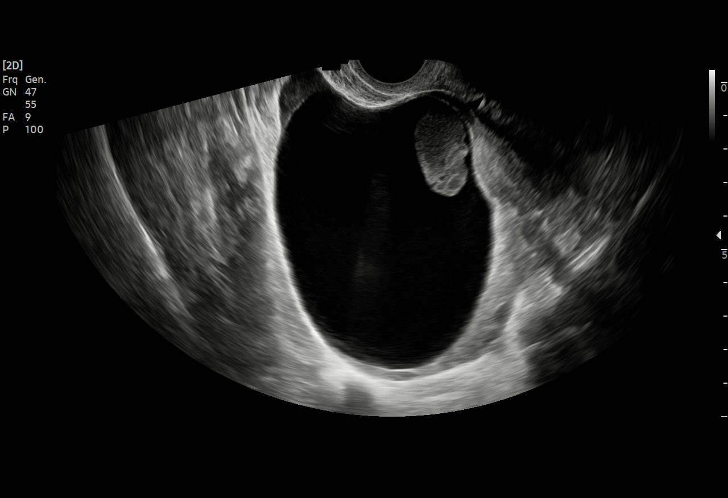
[im 100/109]
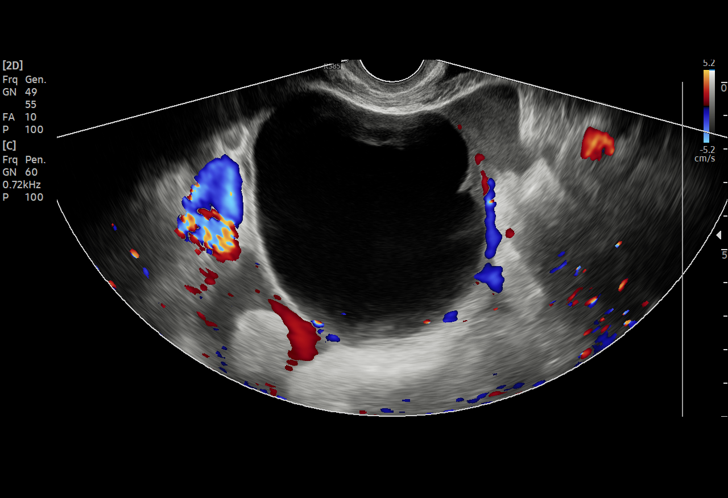
[im 109/109]
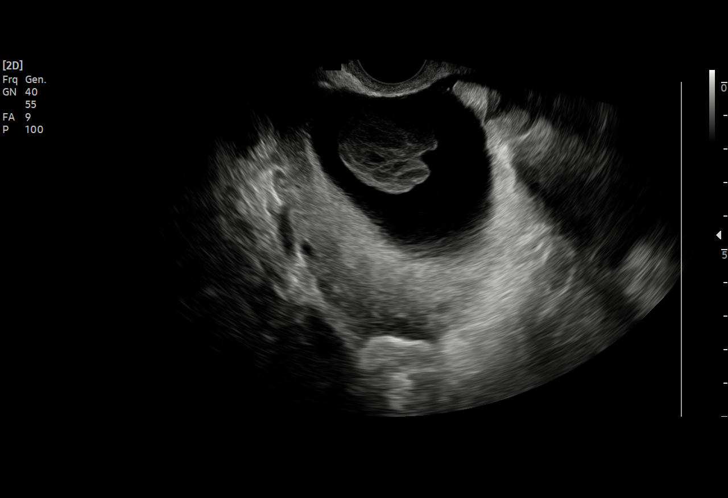

[14 of 25 positions shown; findings below may reference images not displayed]

FINDINGS: Uterus

Measurements: 7.1 x 3.6 x 4 cm = volume: 53.8 mL. Anteverted uterus.
No concerning uterine mass or lesion.

Endometrium

Thickness: 4 mm, non thickened.  No focal endometrial abnormality.

Right ovary

Measurements: 10.1 x 8 x 8.5 cm = volume: 362.8 mL. 8.9 x 6.3 x 8 cm
predominantly cystic appearing mass with some low level homogeneous
internal echogenicity along the periphery (image 100) as well as a
larger heterogeneous though avascular mural nodule measuring up to 3
cm in size (image 96).

Left ovary

Measurements: 4.3 x 2.5 x 2.6 cm = volume: 14.4 mL. Normal follicles
in the left ovary. No concerning left ovarian mass is seen.

Pulsed Doppler evaluation of both ovaries demonstrates normal
low-resistance arterial and venous waveforms.

Other findings

Small to moderate volume of hemoperitoneum best demonstrated on
image 68.
IMPRESSION: Large solid cystic mass arising from the right ovary with some
conspicuous internal complexity. Portion of this mass demonstrates
homogeneous low level echogenicity with convex margins which could
suggest an underlying hemorrhagic lesion with a larger avascular
mural nodule possibly reflecting adherent clot. However, based on
the size of this lesion, outpatient MR imaging should be pursued
with close clinical follow-up by gynecology.

Small volume of hemoperitoneum could be related to cyst rupture or
possible rupture of the aforementioned possible hemorrhagic cyst.

Preserved Doppler waveforms of the right and left ovary though
certainly given the size of the mass in the right adnexa,
intermittent torsion cannot be fully excluded.

These results were called by telephone at the time of interpretation
on 09/20/2020 at [DATE] to provider B[facility] , who verbally
acknowledged these results.

## 2023-03-12 IMAGING — US US ART/VEN ABD/PELV/SCROTUM DOPPLER LTD
1 series · 14 of 25 positions shown · non-contrast
Comparison: CT 09/19/2020

CLINICAL DATA: Right lower quadrant pain, ovarian mass

EXAM:
TRANSABDOMINAL AND TRANSVAGINAL ULTRASOUND OF PELVIS
DOPPLER ULTRASOUND OF OVARIES
TECHNIQUE: Both transabdominal and transvaginal ultrasound examinations of the
pelvis were performed. Transabdominal technique was performed for
global imaging of the pelvis including uterus, ovaries, adnexal
regions, and pelvic cul-de-sac.
It was necessary to proceed with endovaginal exam following the
transabdominal exam to visualize the uterus, endometrium, and
ovaries. Color and duplex Doppler ultrasound was utilized to
evaluate blood flow to the ovaries.

[Series 1: us pelvis complete mc & wl · 14 of 109 slices shown]
[im 1/109]
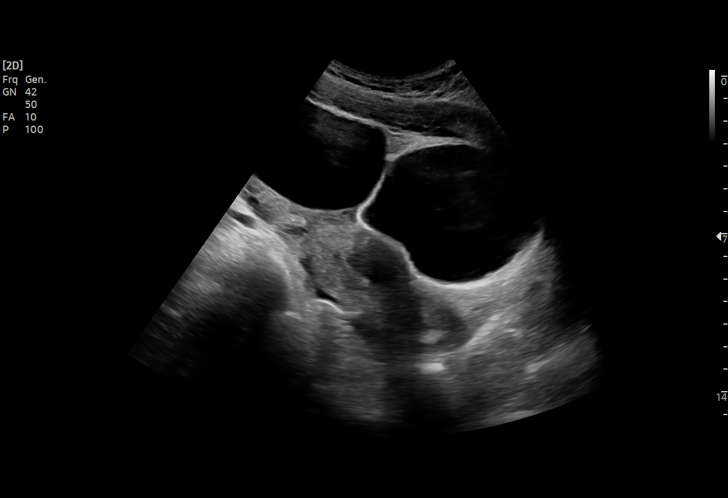
[im 10/109]
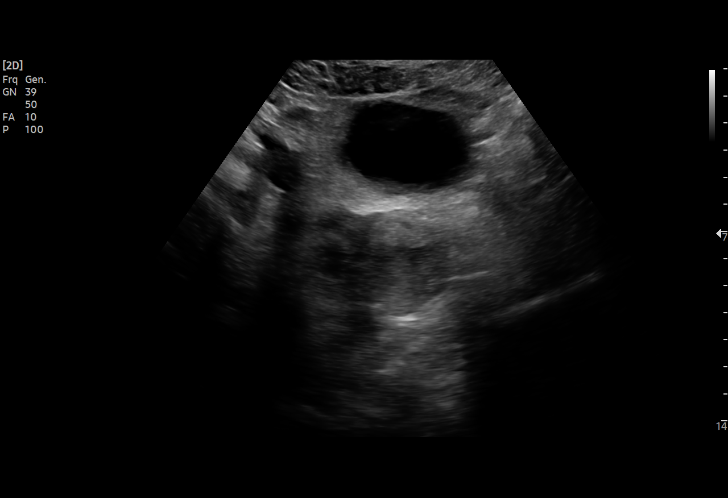
[im 19/109]
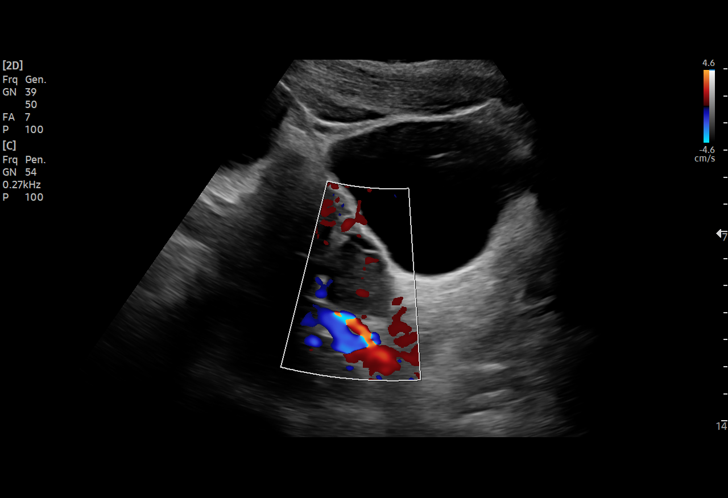
[im 28/109]
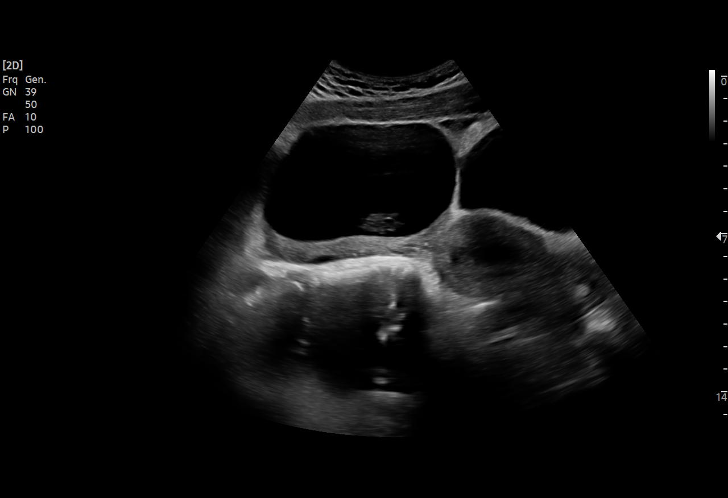
[im 37/109]
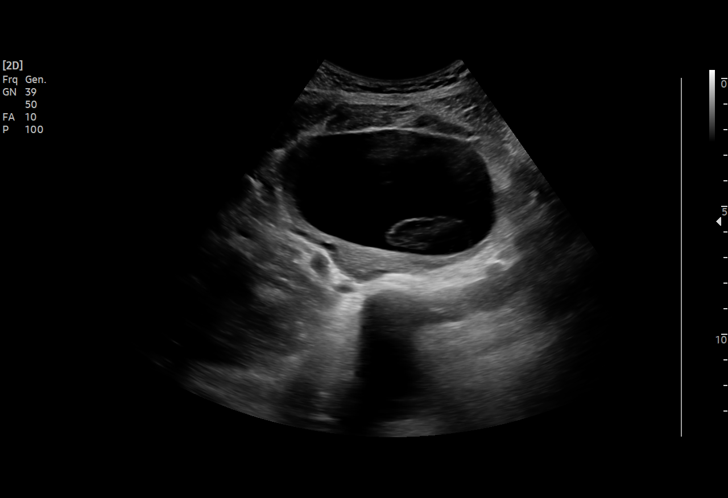
[im 41/109]
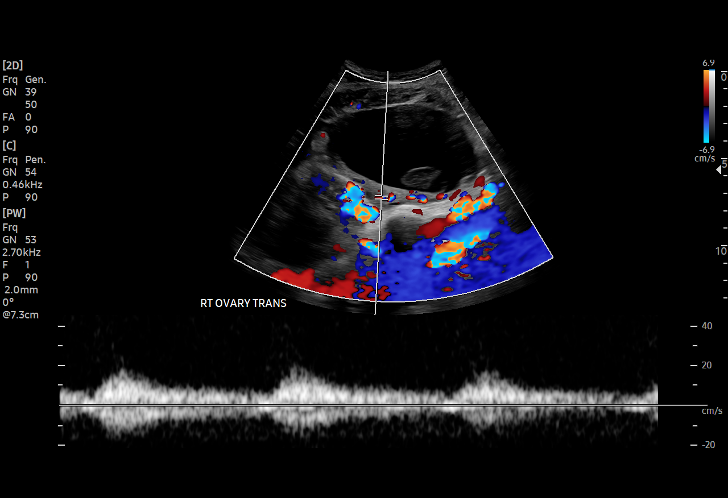
[im 50/109]
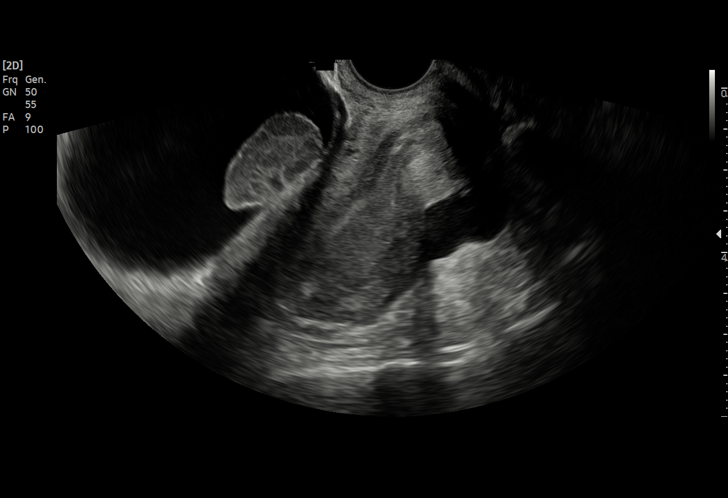
[im 59/109]
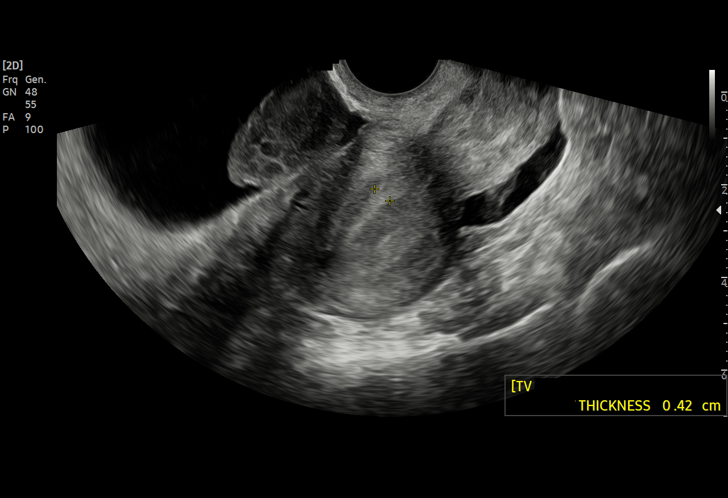
[im 68/109]
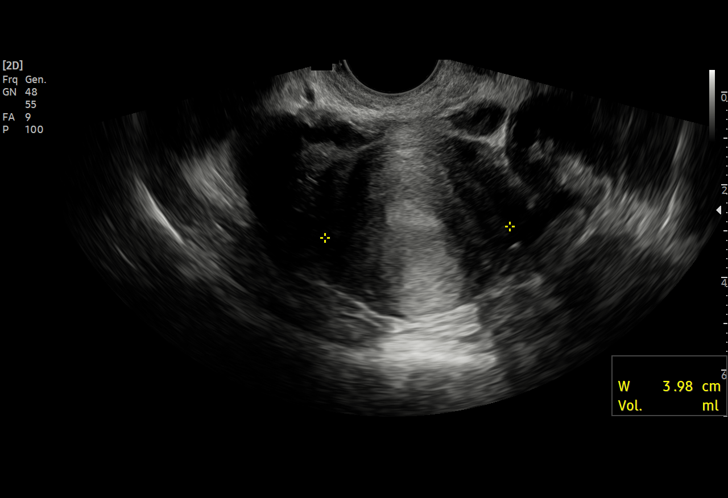
[im 73/109]
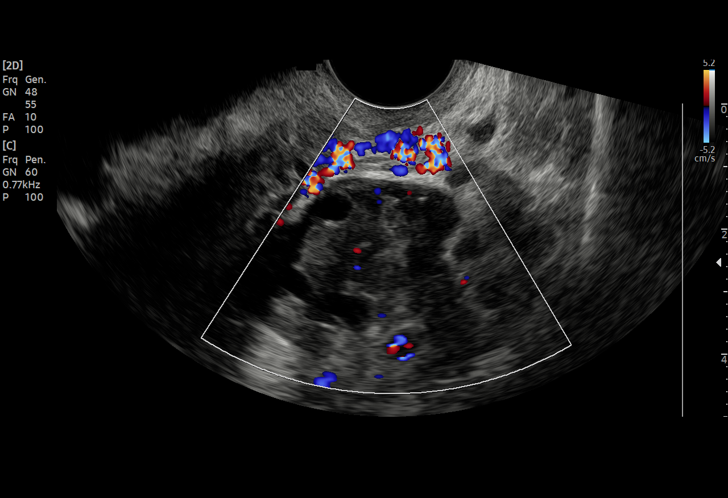
[im 82/109]
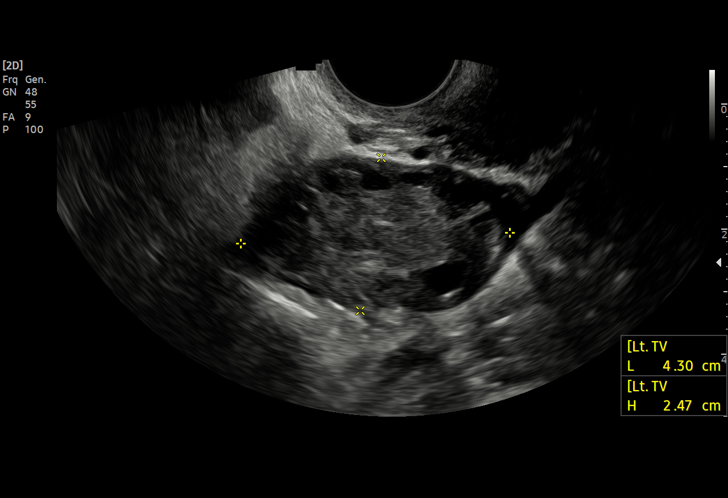
[im 91/109]
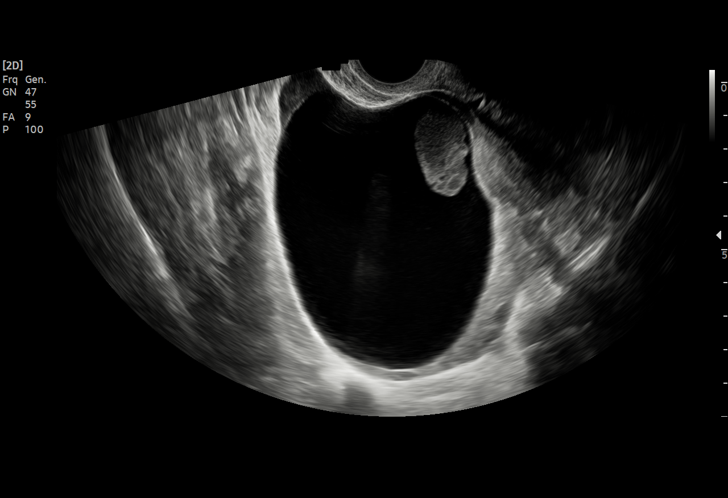
[im 100/109]
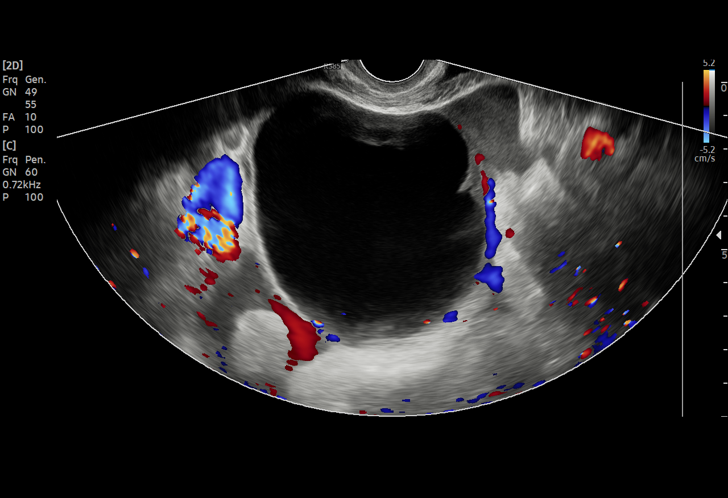
[im 109/109]
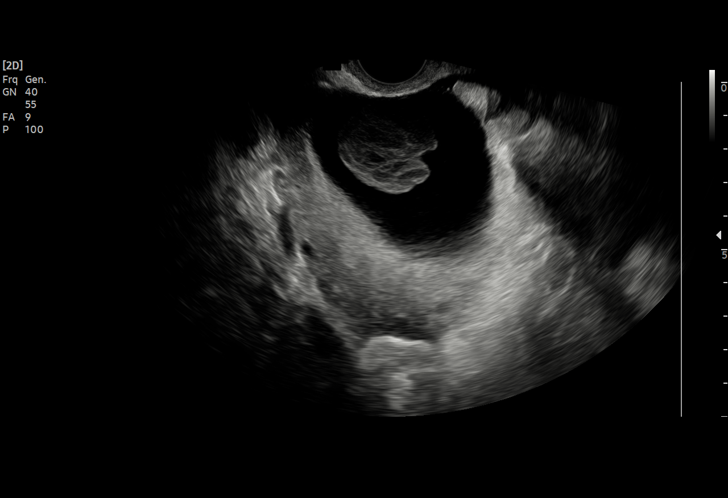

[14 of 25 positions shown; findings below may reference images not displayed]

FINDINGS: Uterus

Measurements: 7.1 x 3.6 x 4 cm = volume: 53.8 mL. Anteverted uterus.
No concerning uterine mass or lesion.

Endometrium

Thickness: 4 mm, non thickened.  No focal endometrial abnormality.

Right ovary

Measurements: 10.1 x 8 x 8.5 cm = volume: 362.8 mL. 8.9 x 6.3 x 8 cm
predominantly cystic appearing mass with some low level homogeneous
internal echogenicity along the periphery (image 100) as well as a
larger heterogeneous though avascular mural nodule measuring up to 3
cm in size (image 96).

Left ovary

Measurements: 4.3 x 2.5 x 2.6 cm = volume: 14.4 mL. Normal follicles
in the left ovary. No concerning left ovarian mass is seen.

Pulsed Doppler evaluation of both ovaries demonstrates normal
low-resistance arterial and venous waveforms.

Other findings

Small to moderate volume of hemoperitoneum best demonstrated on
image 68.
IMPRESSION: Large solid cystic mass arising from the right ovary with some
conspicuous internal complexity. Portion of this mass demonstrates
homogeneous low level echogenicity with convex margins which could
suggest an underlying hemorrhagic lesion with a larger avascular
mural nodule possibly reflecting adherent clot. However, based on
the size of this lesion, outpatient MR imaging should be pursued
with close clinical follow-up by gynecology.

Small volume of hemoperitoneum could be related to cyst rupture or
possible rupture of the aforementioned possible hemorrhagic cyst.

Preserved Doppler waveforms of the right and left ovary though
certainly given the size of the mass in the right adnexa,
intermittent torsion cannot be fully excluded.

These results were called by telephone at the time of interpretation
on 09/20/2020 at [DATE] to provider Bukhari , who verbally
acknowledged these results.

## 2023-03-12 IMAGING — CT CT ABD-PELV W/ CM
2 of 4 series · 16 of 46 positions shown, 18 images · IV contrast (omnipaque)
Comparison: None.

CLINICAL DATA: Right lower quadrant abdominal pain, potential
appendicitis

EXAM:
CT ABDOMEN AND PELVIS WITH CONTRAST
TECHNIQUE: Multidetector CT imaging of the abdomen and pelvis was performed
using the standard protocol following bolus administration of
intravenous contrast.
CONTRAST:  80mL OMNIPAQUE IOHEXOL 350 MG/ML SOLN

[Series 2: axial st · axial · 0.72mm/px · z∈[+1146,+1541]mm · 13 of 91 slices shown, 15 images]
[im 6/91  soft-tissue]
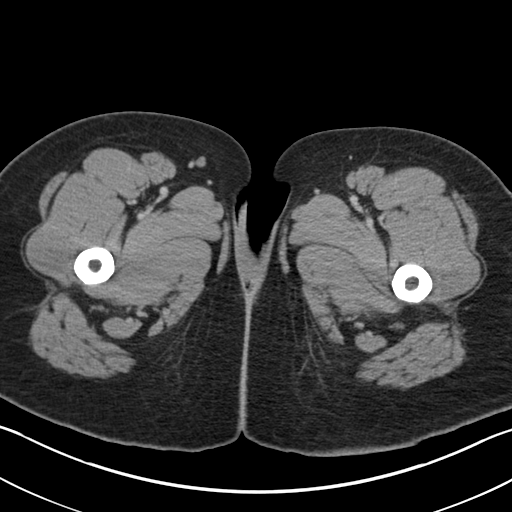
[im 6/91  bone]
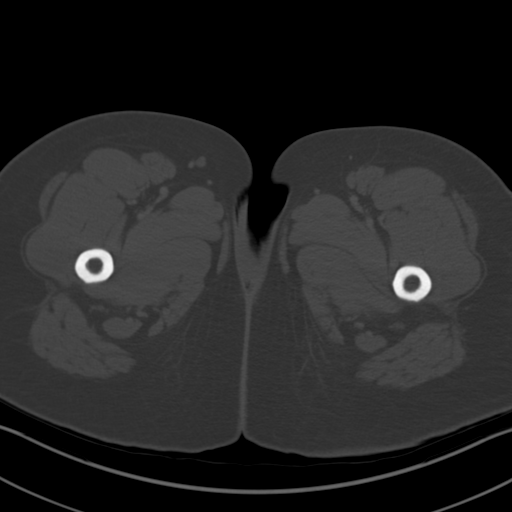
[im 11/91  soft-tissue]
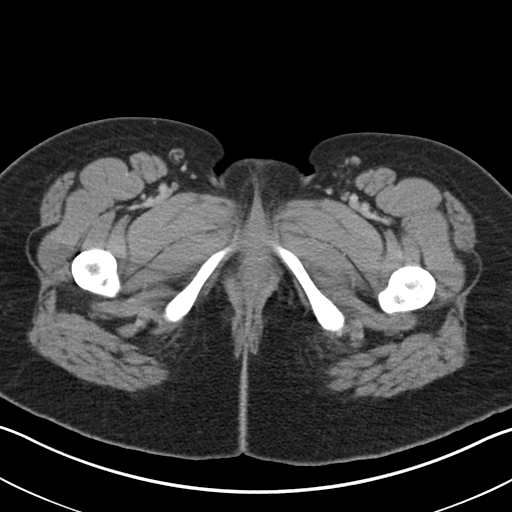
[im 22/91  soft-tissue]
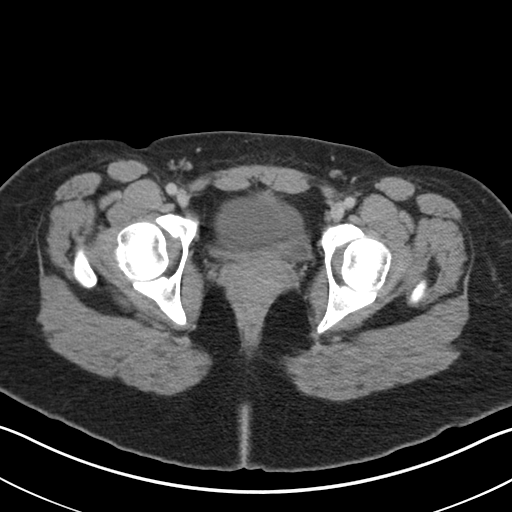
[im 27/91  soft-tissue]
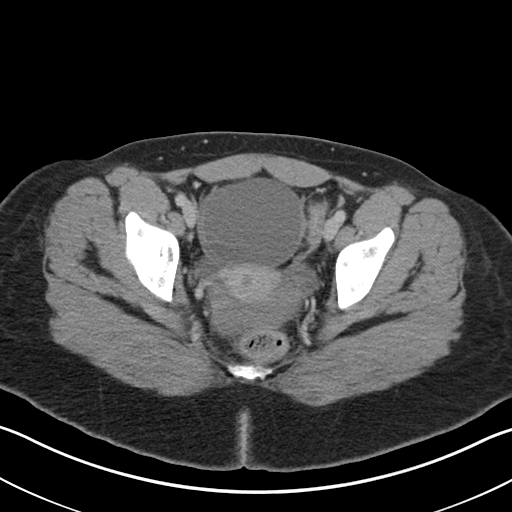
[im 32/91  soft-tissue]
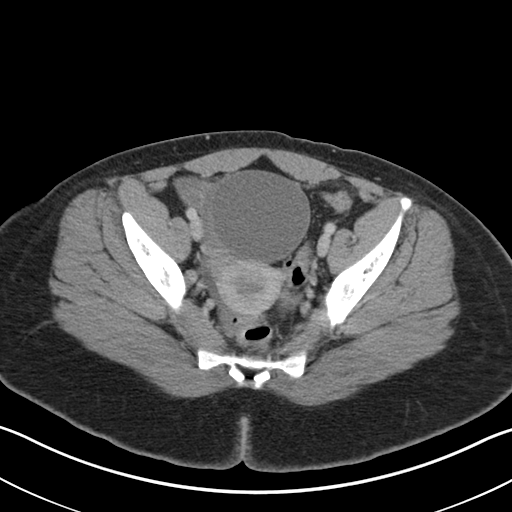
[im 38/91  soft-tissue]
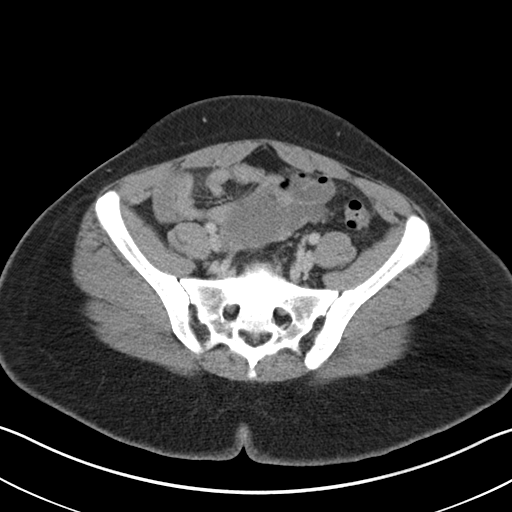
[im 48/91  soft-tissue]
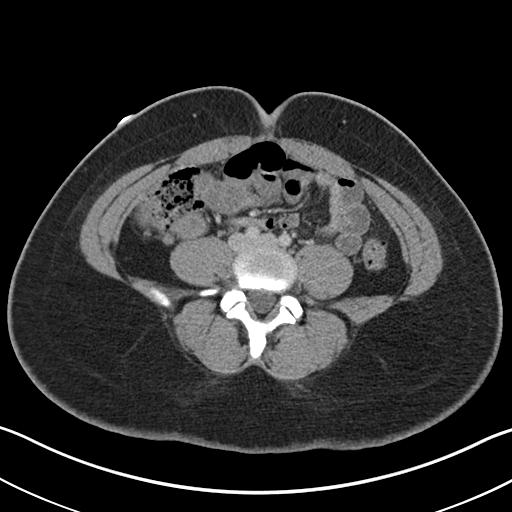
[im 53/91  soft-tissue]
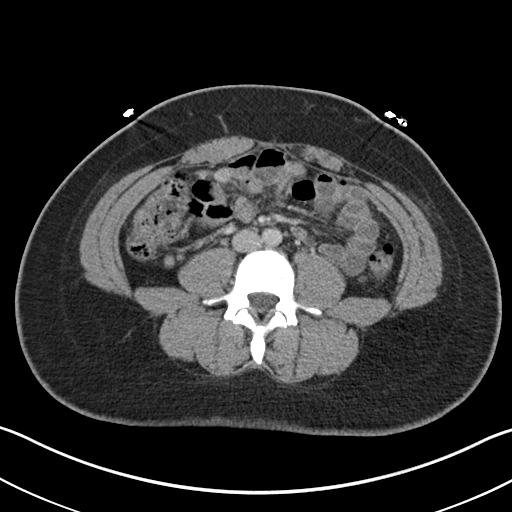
[im 59/91  soft-tissue]
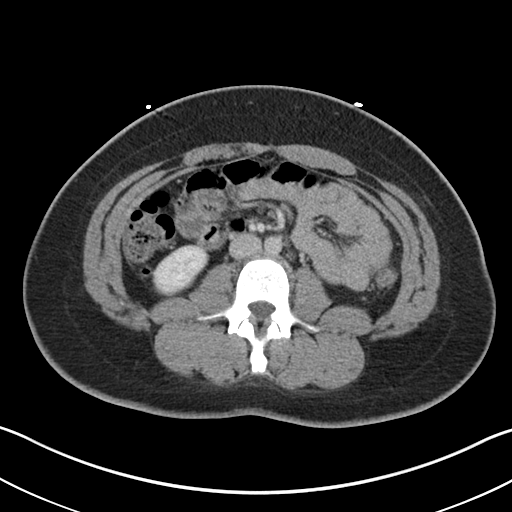
[im 59/91  bone]
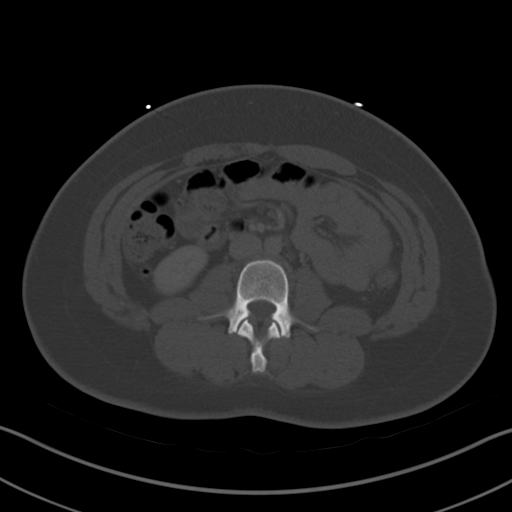
[im 64/91  soft-tissue]
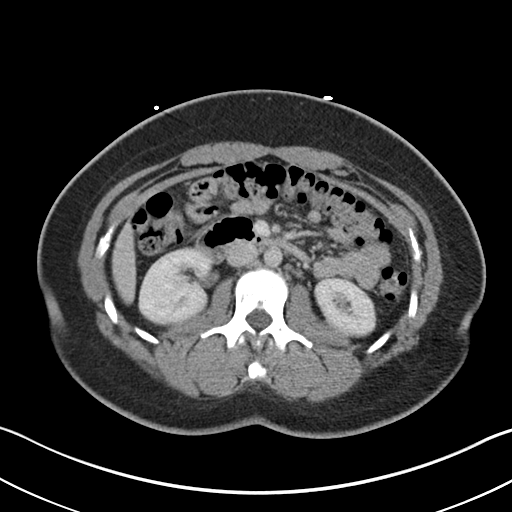
[im 69/91  soft-tissue]
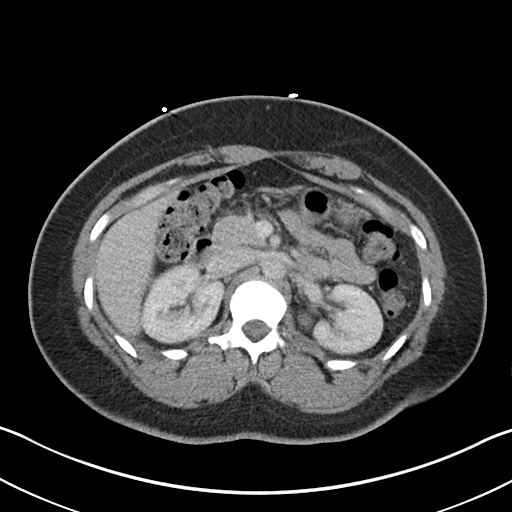
[im 80/91  soft-tissue]
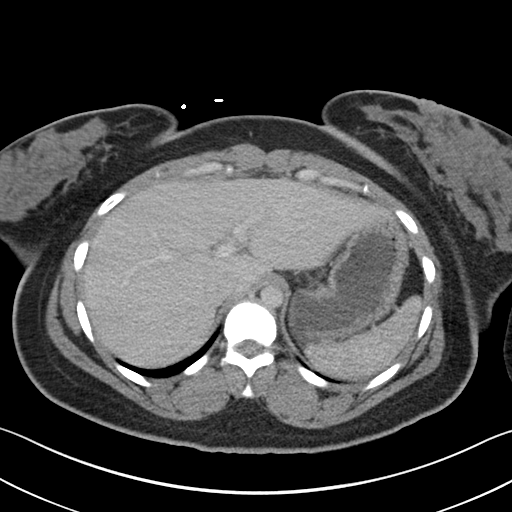
[im 85/91  soft-tissue]
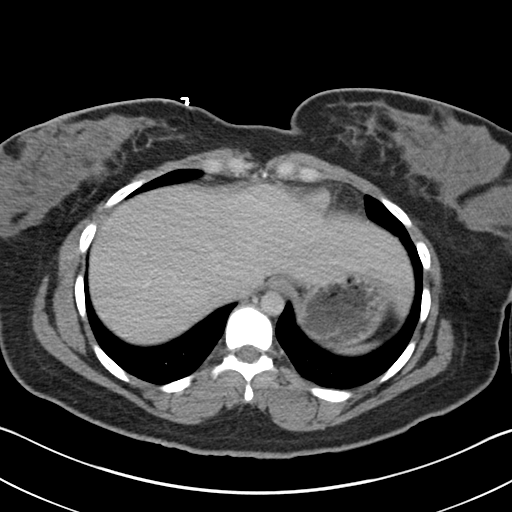

[Series 4: coronal st · coronal · 0.87mm/px · 3 of 132 slices shown]
[im 44/132  soft-tissue]
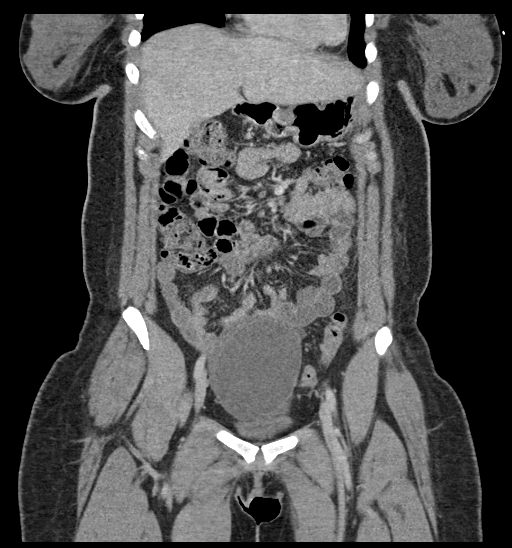
[im 59/132  soft-tissue]
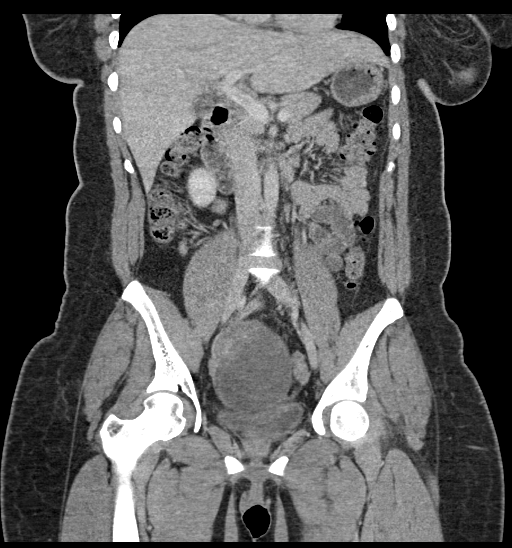
[im 73/132  soft-tissue]
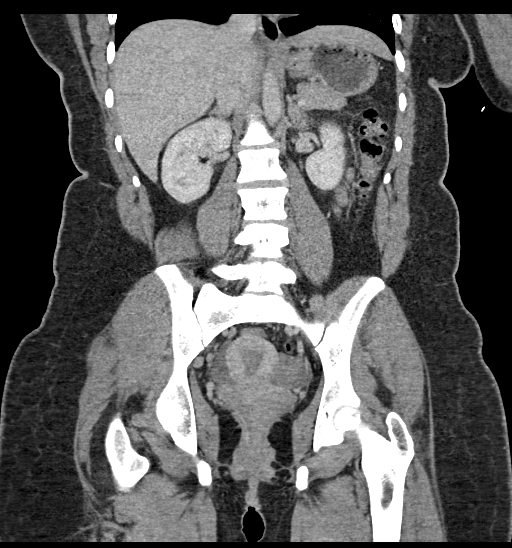

[16 of 46 positions shown; findings below may reference images not displayed]

FINDINGS: Lower chest: Lung bases are clear. Normal heart size. No pericardial
effusion.

Hepatobiliary: No worrisome focal liver lesions. Smooth liver
surface contour. Normal hepatic attenuation. Normal gallbladder and
biliary tree.

Pancreas: No pancreatic ductal dilatation or surrounding
inflammatory changes.

Spleen: Normal in size. No concerning splenic lesions.

Adrenals/Urinary Tract: Normal adrenal glands. Kidneys are normally
located with symmetric enhancement. No suspicious renal lesion,
urolithiasis or hydronephrosis. Urinary bladder is unremarkable for
the degree of distention.

Stomach/Bowel: Distal esophagus, stomach and duodenal sweep are
unremarkable. No small bowel wall thickening or dilatation. No
evidence of obstruction. Partially air-filled appendix is seen in
the right lower quadrant without focal periappendiceal inflammation
to suggest an acute appendicitis. No colonic dilatation or wall
thickening.

Vascular/Lymphatic: No significant vascular findings are present. No
enlarged abdominal or pelvic lymph nodes.

Reproductive: Large 7.2 cm cystic lesion arising from the right
ovary which appears displaced anteriorly to the uterus. The uterus
itself appears slightly retroverted. No concerning left adnexal mass
or lesion.

Other: Small volume low to intermediate attenuation free fluid in
the deep pelvis, indeterminate though could reflect small volume
hemoperitoneum such as from a cyst rupture or other pelvic process.

Musculoskeletal: No acute osseous abnormality or suspicious osseous
lesion.
IMPRESSION: 1. 7.2 cm cystic lesion arising from the right ovary. Given that
this is ipsilateral to the patient's symptoms, may warrant further
interrogation with pelvic ultrasound and Doppler interrogation.
2. Small volume of low to intermediate attenuation (30 HU) fluid in
the deep pelvis is nonspecific, though could reflect some trace
hemorrhagic products such as related to cyst rupture. Could also be
further assessed at the time of ultrasound.
3. Normal appendix.
